# Patient Record
Sex: Male | Born: 1995 | Race: Black or African American | Hispanic: No | Marital: Single | Smoking: Never smoker
Health system: Southern US, Community
[De-identification: ages and names within clinical notes are randomized; demographics above are authoritative.]

## PROBLEM LIST (undated history)

## (undated) DIAGNOSIS — J45909 Unspecified asthma, uncomplicated: Secondary | ICD-10-CM

## (undated) HISTORY — PX: TONSILLECTOMY: SUR1361

---

## 2013-05-13 ENCOUNTER — Ambulatory Visit: Payer: BC Managed Care – PPO | Admitting: Internal Medicine

## 2013-05-13 VITALS — BP 110/60 | HR 62 | Temp 98.6°F | Resp 16 | Ht 70.0 in | Wt 177.0 lb

## 2013-05-13 DIAGNOSIS — S3120XA Unspecified open wound of penis, initial encounter: Secondary | ICD-10-CM

## 2013-05-13 MED ORDER — MUPIROCIN 2 % EX OINT: 1.0000 "application " | TOPICAL_OINTMENT | Freq: Three times a day (TID) | CUTANEOUS | Status: AC

## 2013-05-13 MED ORDER — DOXYCYCLINE HYCLATE 100 MG PO TABS: 100.0000 mg | ORAL_TABLET | Freq: Two times a day (BID) | ORAL | Status: AC

## 2013-05-13 NOTE — Progress Notes (Signed)
   Subjective:    Patient ID: Nicholas Barrera, male    DOB: 03/17/1995, 18 y.o.   MRN: [id number]  HPI pt her c/o of a scar in his penis x  2 months. Now it is getting bigger, itchy and irritated. Two sexual partners within the last 6 months no protection used. He has never had this before. Denies discharge, fever, chills or abdominal pain. Denies any sti in the past. No STD hx. No sores, blisters seen. No rashes seen   Review of Systems     Objective:   Physical Exam  Vitals reviewed. Constitutional: He is oriented to person, place, and time. He appears well-developed and well-nourished. No distress.  HENT:  Head: Normocephalic.  Mouth/Throat: Oropharynx is clear and moist.  Eyes: EOM are normal.  Neck: Normal range of motion.  Pulmonary/Chest: Effort normal.  Abdominal: Hernia confirmed negative in the right inguinal area and confirmed negative in the left inguinal area.  Genitourinary: Testes normal.    Circumcised.  Small healing abrasion base of glans. No erythema, no lesions  Musculoskeletal: Normal range of motion.  Lymphadenopathy:       Right: No inguinal adenopathy present.       Left: No inguinal adenopathy present.  Neurological: He is alert and oriented to person, place, and time. He exhibits normal muscle tone. Coordination normal.  Skin: Rash noted. No erythema.  Psychiatric: He has a normal mood and affect.          Assessment & Plan:  Most likely abrasion penis No hx for HSV but distant possiblity/Nothing to culture Doxycycline/Mupirocin till healed Reat penis from all activity 1 week

## 2013-05-13 NOTE — Patient Instructions (Signed)
Herpes Simplex Herpes simplex is generally classified as Type 1 or Type 2. Type 1 is generally the type that is responsible for cold sores. Type 2 is generally associated with sexually transmitted diseases. We now know that most of the thoughts on these viruses are inaccurate. We find that HSV1 is also present genitally and HSV2 can be present orally, but this will vary in different locations of the world. Herpes simplex is usually detected by doing a culture. Blood tests are also available for this virus; however, the accuracy is often not as good.  PREPARATION FOR TEST No preparation or fasting is necessary. NORMAL FINDINGS  No virus present  No HSV antigens or antibodies present Ranges for normal findings may vary among different laboratories and hospitals. You should always check with your doctor after having lab work or other tests done to discuss the meaning of your test results and whether your values are considered within normal limits. MEANING OF TEST  Your caregiver will go over the test results with you and discuss the importance and meaning of your results, as well as treatment options and the need for additional tests if necessary. OBTAINING THE TEST RESULTS  It is your responsibility to obtain your test results. Ask the lab or department performing the test when and how you will get your results. Document Released: 02/23/2004 Document Revised: 04/14/2011 Document Reviewed: 01/01/2008 ExitCare Patient Information 2014 [employer].  

## 2014-07-24 ENCOUNTER — Emergency Department (HOSPITAL_COMMUNITY)
Admission: EM | Admit: 2014-07-24 | Discharge: 2014-07-24 | Disposition: A | Discharge status: Home / Self Care | Payer: BLUE CROSS/BLUE SHIELD | Attending: Emergency Medicine | Admitting: Emergency Medicine

## 2014-07-24 ENCOUNTER — Encounter (HOSPITAL_COMMUNITY): Payer: Self-pay | Admitting: Emergency Medicine

## 2014-07-24 DIAGNOSIS — Z79899 Other long term (current) drug therapy: Secondary | ICD-10-CM | POA: Diagnosis not present

## 2014-07-24 DIAGNOSIS — F419 Anxiety disorder, unspecified: Secondary | ICD-10-CM | POA: Insufficient documentation

## 2014-07-24 DIAGNOSIS — R251 Tremor, unspecified: Secondary | ICD-10-CM | POA: Insufficient documentation

## 2014-07-24 DIAGNOSIS — Z7951 Long term (current) use of inhaled steroids: Secondary | ICD-10-CM | POA: Diagnosis not present

## 2014-07-24 DIAGNOSIS — R001 Bradycardia, unspecified: Secondary | ICD-10-CM | POA: Diagnosis not present

## 2014-07-24 DIAGNOSIS — J45909 Unspecified asthma, uncomplicated: Secondary | ICD-10-CM | POA: Diagnosis not present

## 2014-07-24 HISTORY — DX: Unspecified asthma, uncomplicated: J45.909

## 2014-07-24 LAB — I-STAT CHEM 8, ED
BUN: 14 mg/dL (ref 6–20)
Calcium, Ion: 1.18 mmol/L (ref 1.12–1.23)
Chloride: 102 mmol/L (ref 101–111)
Creatinine, Ser: 1.1 mg/dL (ref 0.61–1.24)
Glucose, Bld: 90 mg/dL (ref 65–99)
HCT: 44 % (ref 39.0–52.0)
HEMOGLOBIN: 15 g/dL (ref 13.0–17.0)
Potassium: 3.8 mmol/L (ref 3.5–5.1)
SODIUM: 138 mmol/L (ref 135–145)
TCO2: 23 mmol/L (ref 0–100)

## 2014-07-24 NOTE — ED Notes (Addendum)
Pt from home. States he "feels weird." Drove home from [place] this after noon and around 2:15am his muscles began twitching. He has no complaints of pain or discomfort at this time. His peripheral pulses are strong and within normal limits.

## 2014-07-24 NOTE — ED Notes (Signed)
Pt unable to remain still or relax for vital signs.

## 2014-07-24 NOTE — ED Provider Notes (Signed)
CSN: [id number]     Arrival date & time 07/24/14  0227 History   First MD Initiated Contact with Patient 07/24/14 0408     Chief Complaint  Patient presents with  . Tremors     (Consider location/radiation/quality/duration/timing/severity/associated sxs/prior Treatment) HPI Comments: 19 yo male with chief complaint of shaking/jitteriness and muscle twitching.  This began after he went to sleep early this morning.  He reported driving back to [place] from his home in [place].  During the drive he drank a mountain dew (which he doesn't normally drink).  He also reports drinking sweet tea throughout the day today.  He denied chest pain, heart palpitations, shortness of breath, nausea, or vomiting.  Currently, he feels much better, but symptoms were severe at first.  Duration was a few hours.     Past Medical History  Diagnosis Date  . Asthma    Past Surgical History  Procedure Laterality Date  . Tonsillectomy     History reviewed. No pertinent family history. History  Substance Use Topics  . Smoking status: Never Smoker   . Smokeless tobacco: Not on file  . Alcohol Use: No    Review of Systems  All other systems reviewed and are negative.     Allergies  Review of patient's allergies indicates no known allergies.  Home Medications   Prior to Admission medications   Medication Sig Start Date End Date Taking? Authorizing Provider  budesonide-formoterol (SYMBICORT) 80-4.5 MCG/ACT inhaler Inhale 2 puffs into the lungs daily.   Yes Historical Provider, MD  cetirizine (ZYRTEC) 10 MG tablet Take 10 mg by mouth daily.   Yes Historical Provider, MD  albuterol (PROVENTIL HFA;VENTOLIN HFA) 108 (90 BASE) MCG/ACT inhaler Inhale 2 puffs into the lungs every 6 (six) hours as needed for wheezing or shortness of breath.    Historical Provider, MD  doxycycline (VIBRA-TABS) 100 MG tablet Take 1 tablet (100 mg total) by mouth 2 (two) times daily. Patient not taking: Reported on 07/24/2014  05/13/13   Jonita Albee, MD  mupirocin ointment (BACTROBAN) 2 % Apply 1 application topically 3 (three) times daily. Patient not taking: Reported on 07/24/2014 05/13/13   Ashley Jacobs Guest, MD   BP 115/77 mmHg  Pulse 60  Temp(Src)   Resp 13  SpO2 98% Physical Exam  Constitutional: He is oriented to person, place, and time. He appears well-developed and well-nourished. No distress.  HENT:  Head: Normocephalic and atraumatic.  Mouth/Throat: Oropharynx is clear and moist.  Eyes: Conjunctivae are normal. Pupils are equal, round, and reactive to light. No scleral icterus.  Neck: Neck supple.  Cardiovascular: Regular rhythm, normal heart sounds and intact distal pulses.  Bradycardia present.   No murmur heard. Pulmonary/Chest: Effort normal and breath sounds normal. No stridor. No respiratory distress. He has no wheezes. He has no rales.  Abdominal: Soft. He exhibits no distension. There is no tenderness.  Musculoskeletal: Normal range of motion. He exhibits no edema.  Neurological: He is alert and oriented to person, place, and time.  Skin: Skin is warm and dry. No rash noted.  Psychiatric: He has a normal mood and affect. His behavior is normal.  Nursing note and vitals reviewed.   ED Course  Procedures (including critical care time) Labs Review Labs Reviewed  URINALYSIS, ROUTINE W REFLEX MICROSCOPIC (NOT AT [facility])  URINE RAPID DRUG SCREEN, HOSP PERFORMED  I-STAT CHEM 8, ED    Imaging Review No results found.   EKG Interpretation   Date/Time:  Monday July 24 2014 04:11:43 EDT Ventricular Rate:  38 PR Interval:  181 QRS Duration: 91 QT Interval:  475 QTC Calculation: 378 R Axis:   86 Text Interpretation:  Sinus bradycardia Probable left ventricular  hypertrophy ST elev, probable normal early repol pattern No old tracing to  compare Confirmed by [facility]  MD, TREY ([id number]) on 07/24/2014 4:48:23 AM      MDM   Final diagnoses:  Episode of shaking    Well appearing 19 yo male  with complaint of muscle twitching and jitteriness.  He felt anxious.  Symptoms better now.  I suspect symptoms were likely secondary to caffeine ingestion, although could also be stress related.  He is bradycardic, but reports being chronically so.  Chem 8 was negative.  EKG notable for bradycardia and early repolarization.  No syncope or presyncope.  Advised discontinuing caffeine and outpatient follow up.      Blake Divine, MD 07/24/14 [phone]

## 2014-09-26 ENCOUNTER — Emergency Department (HOSPITAL_COMMUNITY): Payer: BLUE CROSS/BLUE SHIELD

## 2014-09-26 ENCOUNTER — Emergency Department (HOSPITAL_COMMUNITY)
Admission: EM | Admit: 2014-09-26 | Discharge: 2014-09-26 | Disposition: A | Discharge status: Home / Self Care | Payer: BLUE CROSS/BLUE SHIELD | Attending: Emergency Medicine | Admitting: Emergency Medicine

## 2014-09-26 ENCOUNTER — Encounter (HOSPITAL_COMMUNITY): Payer: Self-pay

## 2014-09-26 DIAGNOSIS — K5900 Constipation, unspecified: Secondary | ICD-10-CM | POA: Diagnosis not present

## 2014-09-26 DIAGNOSIS — Z79899 Other long term (current) drug therapy: Secondary | ICD-10-CM | POA: Insufficient documentation

## 2014-09-26 DIAGNOSIS — Z7951 Long term (current) use of inhaled steroids: Secondary | ICD-10-CM | POA: Diagnosis not present

## 2014-09-26 DIAGNOSIS — J45909 Unspecified asthma, uncomplicated: Secondary | ICD-10-CM | POA: Diagnosis not present

## 2014-09-26 DIAGNOSIS — R1084 Generalized abdominal pain: Secondary | ICD-10-CM | POA: Diagnosis present

## 2014-09-26 MED ORDER — GI COCKTAIL ~~LOC~~
30.0000 mL | Freq: Once | ORAL | Status: AC
  Administered 2014-09-26: 30 mL via ORAL
  Filled 2014-09-26: qty 30

## 2014-09-26 MED ORDER — POLYETHYLENE GLYCOL 3350 17 GM/SCOOP PO POWD: 17.0000 g | Freq: Every day | ORAL | Status: AC

## 2014-09-26 NOTE — ED Notes (Signed)
Patient transported to X-ray 

## 2014-09-26 NOTE — ED Notes (Signed)
Pt complains of abdominal pain for one day, no vomiting or diarrhea, he states that he broke out in a sweat once

## 2014-09-26 NOTE — Discharge Instructions (Signed)
°Bloating °Bloating is the feeling of fullness in your belly. You may feel as though your pants are too tight. Often the cause of bloating is overeating, retaining fluids, or having gas in your bowel. It is also caused by swallowing air and eating foods that cause gas. Irritable bowel syndrome is one of the most common causes of bloating. Constipation is also a common cause. Sometimes more serious problems can cause bloating. °SYMPTOMS  °Usually there is a feeling of fullness, as though your abdomen is bulged out. There may be mild discomfort.  °DIAGNOSIS  °Usually no particular testing is necessary for most bloating. If the condition persists and seems to become worse, your caregiver may do additional testing.  °TREATMENT  °· There is no direct treatment for bloating. °· Do not put gas into the bowel. Avoid chewing gum and sucking on candy. These tend to make you swallow air. Swallowing air can also be a nervous habit. Try to avoid this. °· Avoiding high residue diets will help. Eat foods with soluble fibers (examples include root vegetables, apples, or barley) and substitute dairy products with soy and rice products. This helps irritable bowel syndrome. °· If constipation is the cause, then a high residue diet with more fiber will help. °· Avoid carbonated beverages. °· Over-the-counter preparations are available that help reduce gas. Your pharmacist can help you with this. °SEEK MEDICAL CARE IF:  °· Bloating continues and seems to be getting worse. °· You notice a weight gain. °· You have a weight loss but the bloating is getting worse. °· You have changes in your bowel habits or develop nausea or vomiting. °SEEK IMMEDIATE MEDICAL CARE IF:  °· You develop shortness of breath or swelling in your legs. °· You have an increase in abdominal pain or develop chest pain. °Document Released: 11/20/2005 Document Revised: 04/14/2011 Document Reviewed: 01/08/2007 °ExitCare® Patient Information ©2015 [employer]. This  information is not intended to replace advice given to you by your health care provider. Make sure you discuss any questions you have with your health care provider. ° °Constipation °Constipation is when a person: °· Poops (has a bowel movement) less than 3 times a week. °· Has a hard time pooping. °· Has poop that is dry, hard, or bigger than normal. °HOME CARE  °· Eat foods with a lot of fiber in them. This includes fruits, vegetables, beans, and whole grains such as brown rice. °· Avoid fatty foods and foods with a lot of sugar. This includes french fries, hamburgers, cookies, candy, and soda. °· If you are not getting enough fiber from food, take products with added fiber in them (supplements). °· Drink enough fluid to keep your pee (urine) clear or pale yellow. °· Exercise on a regular basis, or as told by your doctor. °· Go to the restroom when you feel like you need to poop. Do not hold it. °· Only take medicine as told by your doctor. Do not take medicines that help you poop (laxatives) without talking to your doctor first. °GET HELP RIGHT AWAY IF:  °· You have bright red blood in your poop (stool). °· Your constipation lasts more than 4 days or gets worse. °· You have belly (abdominal) or butt (rectal) pain. °· You have thin poop (as thin as a pencil). °· You lose weight, and it cannot be explained. °MAKE SURE YOU:  °· Understand these instructions. °· Will watch your condition. °· Will get help right away if you are not doing well or   get worse. °Document Released: 07/09/2007 Document Revised: 01/25/2013 Document Reviewed: 11/01/2012 °ExitCare® Patient Information ©2015 [employer]. This information is not intended to replace advice given to you by your health care provider. Make sure you discuss any questions you have with your health care provider. ° ° °

## 2014-09-26 NOTE — ED Provider Notes (Signed)
CSN: [id number]     Arrival date & time 09/26/14  0129 History   First MD Initiated Contact with Patient 09/26/14 [phone]     Chief Complaint  Patient presents with  . Abdominal Pain     (Consider location/radiation/quality/duration/timing/severity/associated sxs/prior Treatment) Patient is a 19 y.o. male presenting with abdominal pain. The history is provided by the patient.  Abdominal Pain Pain location:  Generalized Pain quality: cramping   Pain radiates to:  Does not radiate Pain severity:  Moderate Onset quality:  Sudden Timing:  Constant Progression:  Resolved Chronicity:  New Context: not alcohol use, not recent sexual activity, not recent travel, not retching, not sick contacts, not suspicious food intake and not trauma   Relieved by:  Nothing Worsened by:  Nothing tried Ineffective treatments:  None tried Associated symptoms: no anorexia, no belching, no chest pain, no chills, no cough, no diarrhea, no fever, no flatus, no nausea, no shortness of breath, no sore throat and no vomiting   Risk factors: no alcohol abuse     Past Medical History  Diagnosis Date  . Asthma    Past Surgical History  Procedure Laterality Date  . Tonsillectomy     History reviewed. No pertinent family history. Social History  Substance Use Topics  . Smoking status: Never Smoker   . Smokeless tobacco: None  . Alcohol Use: No    Review of Systems  Constitutional: Negative for fever and chills.  HENT: Negative for sore throat.   Respiratory: Negative for cough and shortness of breath.   Cardiovascular: Negative for chest pain.  Gastrointestinal: Positive for abdominal pain. Negative for nausea, vomiting, diarrhea, anorexia and flatus.  All other systems reviewed and are negative.     Allergies  Review of patient's allergies indicates no known allergies.  Home Medications   Prior to Admission medications   Medication Sig Start Date End Date Taking? Authorizing Provider   albuterol (PROVENTIL HFA;VENTOLIN HFA) 108 (90 BASE) MCG/ACT inhaler Inhale 2 puffs into the lungs every 6 (six) hours as needed for wheezing or shortness of breath.   Yes Historical Provider, MD  budesonide-formoterol (SYMBICORT) 80-4.5 MCG/ACT inhaler Inhale 2 puffs into the lungs daily.   Yes Historical Provider, MD  cetirizine (ZYRTEC) 10 MG tablet Take 10 mg by mouth daily.   Yes Historical Provider, MD  citalopram (CELEXA) 20 MG tablet Take 1 tablet by mouth daily. 09/21/14  Yes Historical Provider, MD  ibuprofen (ADVIL,MOTRIN) 200 MG tablet Take 800 mg by mouth every 6 (six) hours as needed for moderate pain.   Yes Historical Provider, MD  doxycycline (VIBRA-TABS) 100 MG tablet Take 1 tablet (100 mg total) by mouth 2 (two) times daily. Patient not taking: Reported on 07/24/2014 05/13/13   Jonita Albee, MD  mupirocin ointment (BACTROBAN) 2 % Apply 1 application topically 3 (three) times daily. Patient not taking: Reported on 07/24/2014 05/13/13   Ashley Jacobs Guest, MD   BP 110/58 mmHg  Pulse 58  Temp(Src) 98.3 F (36.8 C) (Oral)  Resp 18  Ht  (1.753 m)  Wt 180 lb (81.647 kg)  BMI 26.57 kg/m2  SpO2 100% Physical Exam  Constitutional: He is oriented to person, place, and time. He appears well-developed and well-nourished. No distress.  HENT:  Head: Normocephalic and atraumatic.  Mouth/Throat: Oropharynx is clear and moist.  Eyes: EOM are normal. Pupils are equal, round, and reactive to light.  Neck: Normal range of motion. Neck supple.  Cardiovascular: Normal rate, regular rhythm and intact  distal pulses.   Pulmonary/Chest: Effort normal and breath sounds normal. No respiratory distress. He has no wheezes. He has no rales.  Abdominal: Soft. He exhibits no mass. Bowel sounds are increased. There is no tenderness. There is no rebound, no guarding, no tenderness at McBurney's point and negative Barua's sign.  Musculoskeletal: Normal range of motion.  Neurological: He is alert and  oriented to person, place, and time.  Skin: Skin is warm and dry.  Psychiatric: He has a normal mood and affect.    ED Course  Procedures (including critical care time) Labs Review Labs Reviewed - No data to display  Imaging Review Dg Abd 1 View  09/26/2014   CLINICAL DATA:  Mid generalized abdominal pain for 1 day.  EXAM: ABDOMEN - 1 VIEW  COMPARISON:  None.  FINDINGS: The bowel gas pattern is normal. Small to moderate volume of colonic stool. No evidence of free air. No radio-opaque calculi. No osseous abnormalities.  IMPRESSION: Negative.   Electronically Signed   By: Rubye Oaks M.D.   On: 09/26/2014 05:29   I have personally reviewed and evaluated these images and lab results as part of my medical decision-making.   EKG Interpretation None      MDM   Final diagnoses:  None    Constipation with gas.  Exam and vitals are benign and reassuring.  Patient sleeping soundly in the room and even falls asleep during history and discharge.  Well appearing, safe for discharge at this time.  Start miralax.  Strict return precautions    Marili Vader, MD 09/26/14 [phone]

## 2015-03-01 ENCOUNTER — Encounter (HOSPITAL_COMMUNITY): Payer: Self-pay | Admitting: Emergency Medicine

## 2015-03-01 ENCOUNTER — Emergency Department (HOSPITAL_COMMUNITY)
Admission: EM | Admit: 2015-03-01 | Discharge: 2015-03-01 | Discharge status: Against Medical Advice | Payer: BLUE CROSS/BLUE SHIELD | Attending: Emergency Medicine | Admitting: Emergency Medicine

## 2015-03-01 DIAGNOSIS — R42 Dizziness and giddiness: Secondary | ICD-10-CM | POA: Insufficient documentation

## 2015-03-01 DIAGNOSIS — J45909 Unspecified asthma, uncomplicated: Secondary | ICD-10-CM | POA: Diagnosis not present

## 2015-03-01 LAB — BASIC METABOLIC PANEL
ANION GAP: 10 (ref 5–15)
BUN: 13 mg/dL (ref 6–20)
CO2: 25 mmol/L (ref 22–32)
Calcium: 9 mg/dL (ref 8.9–10.3)
Chloride: 105 mmol/L (ref 101–111)
Creatinine, Ser: 1.2 mg/dL (ref 0.61–1.24)
GFR calc Af Amer: >60 mL/min (ref >60)
GFR calc non Af Amer: >60 mL/min (ref >60)
Glucose, Bld: 96 mg/dL (ref 65–99)
POTASSIUM: 3.8 mmol/L (ref 3.5–5.1)
Sodium: 140 mmol/L (ref 135–145)

## 2015-03-01 LAB — CBC
HEMATOCRIT: 39.9 % (ref 39.0–52.0)
Hemoglobin: 13.2 g/dL (ref 13.0–17.0)
MCH: 30.8 pg (ref 26.0–34.0)
MCHC: 33.1 g/dL (ref 30.0–36.0)
MCV: 93.2 fL (ref 78.0–100.0)
Platelets: 266 10*3/uL (ref 150–400)
RBC: 4.28 MIL/uL (ref 4.22–5.81)
RDW: 12.6 % (ref 11.5–15.5)
WBC: 4.8 10*3/uL (ref 4.0–10.5)

## 2015-03-01 NOTE — ED Notes (Signed)
Per pt, states he drank 4 LOCO drinks and worked out last night-now feels jittery

## 2015-03-10 ENCOUNTER — Emergency Department (HOSPITAL_COMMUNITY): Payer: BLUE CROSS/BLUE SHIELD

## 2015-03-10 ENCOUNTER — Emergency Department (HOSPITAL_COMMUNITY)
Admission: EM | Admit: 2015-03-10 | Discharge: 2015-03-10 | Disposition: A | Discharge status: Home / Self Care | Payer: BLUE CROSS/BLUE SHIELD | Attending: Emergency Medicine | Admitting: Emergency Medicine

## 2015-03-10 DIAGNOSIS — F10120 Alcohol abuse with intoxication, uncomplicated: Secondary | ICD-10-CM | POA: Insufficient documentation

## 2015-03-10 DIAGNOSIS — Y9289 Other specified places as the place of occurrence of the external cause: Secondary | ICD-10-CM | POA: Insufficient documentation

## 2015-03-10 DIAGNOSIS — Y998 Other external cause status: Secondary | ICD-10-CM | POA: Insufficient documentation

## 2015-03-10 DIAGNOSIS — Y9389 Activity, other specified: Secondary | ICD-10-CM | POA: Insufficient documentation

## 2015-03-10 DIAGNOSIS — F10920 Alcohol use, unspecified with intoxication, uncomplicated: Secondary | ICD-10-CM

## 2015-03-10 DIAGNOSIS — R4182 Altered mental status, unspecified: Secondary | ICD-10-CM | POA: Diagnosis present

## 2015-03-10 DIAGNOSIS — X58XXXA Exposure to other specified factors, initial encounter: Secondary | ICD-10-CM | POA: Diagnosis not present

## 2015-03-10 DIAGNOSIS — S81812A Laceration without foreign body, left lower leg, initial encounter: Secondary | ICD-10-CM | POA: Insufficient documentation

## 2015-03-10 LAB — COMPREHENSIVE METABOLIC PANEL
ALT: 32 U/L (ref 17–63)
ANION GAP: 9 (ref 5–15)
AST: 39 U/L (ref 15–41)
Albumin: 4.4 g/dL (ref 3.5–5.0)
Alkaline Phosphatase: 52 U/L (ref 38–126)
BUN: 12 mg/dL (ref 6–20)
CHLORIDE: 111 mmol/L (ref 101–111)
CO2: 25 mmol/L (ref 22–32)
Calcium: 9.1 mg/dL (ref 8.9–10.3)
Creatinine, Ser: 1.16 mg/dL (ref 0.61–1.24)
GFR calc non Af Amer: >60 mL/min (ref >60)
Glucose, Bld: 100 mg/dL — ABNORMAL HIGH (ref 65–99)
POTASSIUM: 4.1 mmol/L (ref 3.5–5.1)
SODIUM: 145 mmol/L (ref 135–145)
Total Bilirubin: 0.4 mg/dL (ref 0.3–1.2)
Total Protein: 7.4 g/dL (ref 6.5–8.1)

## 2015-03-10 LAB — URINALYSIS, ROUTINE W REFLEX MICROSCOPIC
BILIRUBIN URINE: NEGATIVE
Glucose, UA: NEGATIVE mg/dL
HGB URINE DIPSTICK: NEGATIVE
KETONES UR: NEGATIVE mg/dL
Leukocytes, UA: NEGATIVE
NITRITE: NEGATIVE
PH: 5.5 (ref 5.0–8.0)
Protein, ur: NEGATIVE mg/dL
Specific Gravity, Urine: 1.005 (ref 1.005–1.030)

## 2015-03-10 LAB — DIFFERENTIAL
BASOS ABS: 0 10*3/uL (ref 0.0–0.1)
BASOS PCT: 0 %
EOS PCT: 2 %
Eosinophils Absolute: 0.1 10*3/uL (ref 0.0–0.7)
Lymphocytes Relative: 36 %
Lymphs Abs: 1.8 10*3/uL (ref 0.7–4.0)
MONO ABS: 0.2 10*3/uL (ref 0.1–1.0)
Monocytes Relative: 3 %
NEUTROS ABS: 2.9 10*3/uL (ref 1.7–7.7)
Neutrophils Relative %: 59 %

## 2015-03-10 LAB — RAPID URINE DRUG SCREEN, HOSP PERFORMED
AMPHETAMINES: NOT DETECTED
BENZODIAZEPINES: NOT DETECTED
Barbiturates: NOT DETECTED
Cocaine: NOT DETECTED
Opiates: NOT DETECTED
TETRAHYDROCANNABINOL: NOT DETECTED

## 2015-03-10 LAB — I-STAT CHEM 8, ED
BUN: 10 mg/dL (ref 6–20)
CALCIUM ION: 1.2 mmol/L (ref 1.12–1.23)
CHLORIDE: 108 mmol/L (ref 101–111)
Creatinine, Ser: 1.5 mg/dL — ABNORMAL HIGH (ref 0.61–1.24)
Glucose, Bld: 96 mg/dL (ref 65–99)
HEMATOCRIT: 43 % (ref 39.0–52.0)
Hemoglobin: 14.6 g/dL (ref 13.0–17.0)
POTASSIUM: 4 mmol/L (ref 3.5–5.1)
SODIUM: 147 mmol/L — AB (ref 135–145)
TCO2: 27 mmol/L (ref 0–100)

## 2015-03-10 LAB — CBC
HEMATOCRIT: 40.6 % (ref 39.0–52.0)
HEMOGLOBIN: 13.4 g/dL (ref 13.0–17.0)
MCH: 31.6 pg (ref 26.0–34.0)
MCHC: 33 g/dL (ref 30.0–36.0)
MCV: 95.8 fL (ref 78.0–100.0)
Platelets: 269 10*3/uL (ref 150–400)
RBC: 4.24 MIL/uL (ref 4.22–5.81)
RDW: 13.1 % (ref 11.5–15.5)
WBC: 4.9 10*3/uL (ref 4.0–10.5)

## 2015-03-10 LAB — I-STAT TROPONIN, ED: Troponin i, poc: 0.01 ng/mL (ref 0.00–0.08)

## 2015-03-10 LAB — ETHANOL: Alcohol, Ethyl (B): 330 mg/dL (ref <5)

## 2015-03-10 LAB — PROTIME-INR
INR: 0.92 (ref 0.00–1.49)
Prothrombin Time: 12.6 seconds (ref 11.6–15.2)

## 2015-03-10 LAB — APTT: APTT: 31 s (ref 24–37)

## 2015-03-10 LAB — CBG MONITORING, ED: GLUCOSE-CAPILLARY: 87 mg/dL (ref 65–99)

## 2015-03-10 MED ORDER — NALOXONE HCL 2 MG/2ML IJ SOSY
2.0000 mg | PREFILLED_SYRINGE | Freq: Once | INTRAMUSCULAR | Status: AC
  Administered 2015-03-10: 2 mg via INTRAVENOUS

## 2015-03-10 MED ORDER — SODIUM CHLORIDE 0.9 % IV BOLUS (SEPSIS)
1000.0000 mL | Freq: Once | INTRAVENOUS | Status: AC
  Administered 2015-03-10: 1000 mL via INTRAVENOUS

## 2015-03-10 MED ORDER — AMMONIA AROMATIC IN INHA
0.3000 mL | Freq: Once | RESPIRATORY_TRACT | Status: DC
  Filled 2015-03-10: qty 10

## 2015-03-10 NOTE — ED Notes (Signed)
Pt is asleep and won't arouse to sternal rub.  Doesn't smell of ETOH and doesn't appear to be in any distress.  Unable to complete triage assessment d/t pt not awaking.  Placed on pulse ox.  Friend at b/s.

## 2015-03-10 NOTE — ED Notes (Signed)
Pt brought from room 5 unresponsive. Dr Rosalia Hammers at bedside

## 2015-03-10 NOTE — ED Notes (Addendum)
Will not perform stroke swallow screen until pt is awake enough to swallow w/o choking. Dr Rosalia Hammers made aware. Pt also covered with multiple blankets for warming per verbal order

## 2015-03-10 NOTE — ED Notes (Signed)
Pt was found by staff at [facility] on the floor of men's locker room.  He had swiped in at the gym approx an hour after found.  EMS reports that he sat up and spoke to them there, but in route he has become harder to arouse.  EMS VS: 106/50,70 NSR,  20, 96%RA, CBG 119.  Pt's emergency contact is his father but he's in [place].

## 2015-03-10 NOTE — ED Provider Notes (Signed)
CSN: [id number]     Arrival date & time 03/10/15  1436 History   First MD Initiated Contact with Patient 03/10/15 1510     Chief Complaint  Patient presents with  . Altered Mental Status    Level 5 caveat (Consider location/radiation/quality/duration/timing/severity/associated sxs/prior Treatment) HPI 20 y.o. Male reportedly found on floor at gym in locker room unresponsive.  EMS called ad transported to hospital.  En route cbg 119, bp 106/50, hr 70, sats 96%.  GF present here and reports that she thinks he was drinking earlier in the day.  No report of past medical history.  No past medical history on file. No past surgical history on file. No family history on file. Social History  Substance Use Topics  . Smoking status: Not on file  . Smokeless tobacco: Not on file  . Alcohol Use: Not on file    Review of Systems  Unable to perform ROS: Mental status change      Allergies  Review of patient's allergies indicates not on file.  Home Medications   Prior to Admission medications   Not on File   BP 103/51 mmHg  SpO2 97% Physical Exam  Constitutional: He appears well-developed and well-nourished. No distress.  HENT:  Head: Normocephalic and atraumatic.  Right Ear: External ear normal.  Left Ear: External ear normal.  Nose: Nose normal.  Mouth/Throat: Oropharynx is clear and moist.  No gag reflex on initial exam  Eyes:  Pupils 8 and sluggishly reactive  Neck: Normal range of motion. Neck supple. No JVD present. No tracheal deviation present. No thyromegaly present.  Cardiovascular: Normal rate, regular rhythm, normal heart sounds and intact distal pulses.   Pulmonary/Chest: Effort normal and breath sounds normal. No stridor. No respiratory distress. He has no wheezes. He has no rales.  Abdominal: Soft. Bowel sounds are normal. He exhibits no distension. There is tenderness. There is rebound.  Musculoskeletal:  Laceration left lower anterior leg  Neurological: He is  unresponsive.  Patient initially unresponsive but through exam , after narcan, patient becam more responsive with eye opening and answering yes to some questions.  Appears to move all four extremities well.  Nursing note and vitals reviewed.   ED Course  Procedures (including critical care time) Labs Review Labs Reviewed  I-STAT CHEM 8, ED - Abnormal; Notable for the following:    Sodium 147 (*)    Creatinine, Ser 1.50 (*)    All other components within normal limits  PROTIME-INR  APTT  CBC  DIFFERENTIAL  URINALYSIS, ROUTINE W REFLEX MICROSCOPIC (NOT AT [facility])  COMPREHENSIVE METABOLIC PANEL  ETHANOL  URINE RAPID DRUG SCREEN, HOSP PERFORMED  CBG MONITORING, ED  I-STAT TROPOININ, ED  CBG MONITORING, ED    Imaging Review No results found. I have personally reviewed and evaluated these images and lab results as part of my medical decision-making.   EKG Interpretation   Date/Time:  Saturday March 10 2015 15:29:30 EST Ventricular Rate:  82 PR Interval:  181 QRS Duration: 96 QT Interval:  347 QTC Calculation: 405 R Axis:   94 Text Interpretation:  Sinus rhythm Borderline right axis deviation  Probable left ventricular hypertrophy Nonspecific T abnormalities, lateral  leads Confirmed by Zelma Mazariego MD, Duwayne Heck ([id number]) on 03/10/2015 3:56:53 PM      MDM   Final diagnoses:  Alcohol intoxication, uncomplicated (HCC)    Acutely altered mental status 1- ? Seizure with prolonged post ictal stage 2-toxicology- etoh and uds being done 3- infectious- unlikely as no report  of recent illness and rather abrupt onset. 4-ich- ct pending 5-cv- patient with adequate bp and not hypertensive here   5:22 PM Patient with blood along level of 3:30. In terms likely secondary to acute alcohol intoxication. We will recheck at the two-hour point to assure improving mentation 6:06 PM Patient opens eyes and answer some questions. Appears to move all 4 extremities equally. Discussed patient's  alcohol use with girlfriend. He states that the police were called one time earlier last month because he was wondering around the parking lot intoxicated. 9:18 PM Patient awake and alert and appropriately oriented. He wishes to be discharged. I discussed alcohol abuse and his need to seek treatment. He will be given referrals for community resources.  Margarita Grizzle, MD 03/14/15 [phone]

## 2015-03-10 NOTE — Discharge Instructions (Signed)
Alcohol Intoxication Alcohol intoxication occurs when you drink enough alcohol that it affects your ability to function. It can be mild or very severe. Drinking a lot of alcohol in a short time is called binge drinking. This can be very harmful. Drinking alcohol can also be more dangerous if you are taking medicines or other drugs. Some of the effects caused by alcohol may include:  Loss of coordination.  Changes in mood and behavior.  Unclear thinking.  Trouble talking (slurred speech).  Throwing up (vomiting).  Confusion.  Slowed breathing.  Twitching and shaking (seizures).  Loss of consciousness. HOME CARE  Do not drive after drinking alcohol.  Drink enough water and fluids to keep your pee (urine) clear or pale yellow. Avoid caffeine.  Only take medicine as told by your doctor. GET HELP IF:  You throw up (vomit) many times.  You do not feel better after a few days.  You frequently have alcohol intoxication. Your doctor can help decide if you should see a substance use treatment counselor. GET HELP RIGHT AWAY IF:  You become shaky when you stop drinking.  You have twitching and shaking.  You throw up blood. It may look bright red or like coffee grounds.  You notice blood in your poop (bowel movements).  You become lightheaded or pass out (faint). MAKE SURE YOU:   Understand these instructions.  Will watch your condition.  Will get help right away if you are not doing well or get worse.   This information is not intended to replace advice given to you by your health care provider. Make sure you discuss any questions you have with your health care provider.   Document Released: 07/09/2007 Document Revised: 09/22/2012 Document Reviewed: 06/25/2012 Elsevier Interactive Patient Education 2016 [employer]Community Resource Guide Outpatient Counseling/Substance Abuse Adult The United Ways 211 is a great source of information about community services  available.  Access by dialing 2-1-1 from anywhere in [place], or by website -  PooledIncome.plwww.nc211.org.   Other Local Resources (Updated 02/2015)  Crisis Hotlines   Services     Area Served  [facility]  Crisis Hotline, available 24 hours a day, 7 days a week: 986-112-1450323-761-1407 [facility] [place], [place]   [facility]  Crisis Hotline, available 24 hours a day, 7 days a week: ([phone] [phone] [facility], [place]  [facility]  Suicide Prevention Hotline, available 24 hours a day, 7 days a week: 317-443-4180 [facility], [place]  [facility]   Crisis Hotline, available 24 hours a day, 7 days a week: 316-155-9640947-665-6106 [facility], [place]   [facility] Access to [employer]Care Line  Crisis Hotline, available 24 hours a day, 7 days a week: 901-675-4686272-619-3312 All   [facility]  Crisis Hotline, available 24 hours a day, 7 days a week: (775) 118-65166011998600 All   Other Local Resources (Updated 02/2015)  Outpatient Counseling/ Substance Abuse Programs  Services     Address and Phone Number  ADS (Alcohol and Drug Services)   Options include Individual counseling, group counseling, intensive outpatient program (several hours a day, several days a week)  Offers depression assessments  Provides methadone maintenance program [phone] [address], [address] [place], [place] [address]   [facility]   Offers partial hospitalization/day treatment and DUI/DWI programs  [employer]s Medicare, private insurance [phone] [address], [address] [place], [place] [address]  [facility]    Services include intensive outpatient program (several hours a day, several days a week), outpatient treatment, DUI/DWI services, family education  Also has some services specifically for [employer]  Offers transitional housing  [phone] [address] [place], [place] [address]     [facility]  Accepts Medicare, private pay, and private insurance  [phone] [address], [address] [place], [place] [address]  [facility]  Services include individual counseling, substance abuse intensive outpatient program (several hours a day, several days a week), day treatment  Delene Lollccepts Medicare, Medicaid, private insurance [phone] [address], [address] [place], [place] [address]  [facility]   Offers substance abuse intensive outpatient program (several hours a day, several days a week), partial hospitalization program [phone] [address] [place], [place] [address]  [phone] [address] [place], [place] [address]  [phone] [address] [place], [place] [address]  [phone] [address] [place], [place] [address]  [facility]  Individual counseling only  Accepts private insurance only [phone] [address], [address] [place], [place] [address]  [facility]  Methadone maintenance program [phone] [address] [place], [place] [address]  [facility]  Walk-In Clinic providing substance abuse and mental health counseling  Accepts Medicaid, Medicare, private insurance  Offers sliding scale for uninsured [phone] [address] [place], [place]   [facility], [employer].  Offers individual counseling, and intensive in-home services [phone] [address], [address] [place], [place] [address]  [facility]  Offers individual counseling, family counseling, group therapy, domestic violence counseling, consumer credit counseling  Accepts Medicare, Medicaid, private insurance  Offers sliding scale for uninsured [phone] [address] [place], [place] [address]  [phone] [facility], [address] [place], [place] [address]  [facility]  Offers individual, family and group counseling  3 locations - [place], [place], and  [place]  [phone]  [address] [place], [place] [address]  [address] [place], [place] [address]  [address] [place], [place] [address]  [facility]    Offers psychiatric assessment, 8-week Intensive Outpatient Program (several hours a day, several times a week, daytime or evenings), early recovery group, family Program, medication management  Private pay or private insurance only [phone], or  [phone] [place], [place] [address]  [facility]  Offers individual, couples and family counseling  Accepts Medicaid, private insurance, and sliding scale for uninsured [phone] [address] [place], [place] [address]  Len Blalockavid Fuller, MD  Individual counseling  Private insurance [phone] [address] [place], [place] [address]  [facility]   Offers assessment, substance abuse treatment, and behavioral health treatment [phone] [address] [place], [place] [address]  [facility]  Individual counseling  Accepts private insurance [phone] [address] [place], [place] [address]  [facility]  Individual counseling  Delene Lollccepts Medicare, private insurance [phone] [address] [place], [place] [address]  [facility]    Offers intensive outpatient program (several hours a day, several times a week)  Private pay, private insurance [phone] [facility] [address] [facility] [place], [place]  [facility]  Individual counseling  Medicare, private insurance [phone] [address], [address] [place], [place] [address]  [facility]    Offers intensive outpatient program (several hours a day, several times a week) and partial hospitalization program [phone] [address] [place], [place] [address]  Emerson MonteParrish McKinney, MD  Individual counseling [phone] [address], [address]  [place], [place] [address]  [facility]  Offers Christian counseling to individuals, couples, and families  Accepts Medicare and private insurance; offers sliding scale for uninsured [phone] [address] [place], [place] [address]  [facility]  [place] counseling [phone] [address] [place], [place] [address]  [facility] crisis counseling, individual counseling, group therapy, in-home therapy, domestic violence services, day treatment, DWI services, Administrator, arts (CST), Doctor, hospital (ACTT), substance abuse Intensive Outpatient Program (several hours a day, several times a week)  2 locations - [place] [phone] [address] [place], [place] [address]  [phone] [address] [place], [place] [address]  [facility]     Individual counseling and group therapy  [employer], [place], [place] [phone] [address] [place], [place]  [facility]  Offers individual and family counseling  Offers LGBTQ services  Accepts private insurance and private pay [phone] [address] [place], [place] [address]  [facility]    Offers individual counseling, group therapy, and outpatient detox  Accepts private insurance [phone] [address] [place], [place]  [facility]  Individual counseling  Accepts Medicare, private insurance [phone] [address] [place], [place] [address]  [employer]  Individual counseling  Accepts Medicare, private insurance [phone] [address] [place], [place] [address]  [facility]   Offers substance abuse Intensive Outpatient Program (several hours a day, several times a week) [phone], or [phone] [place], [place]

## 2015-03-10 NOTE — ED Notes (Signed)
Bed: WA05 Expected date:  Expected time:  Means of arrival:  Comments: AMS 

## 2016-05-04 IMAGING — CR DG ABDOMEN 1V
1 series · 1 of 1 positions shown · non-contrast
Comparison: None.

CLINICAL DATA: Mid generalized abdominal pain for 1 day.

EXAM:
ABDOMEN - 1 VIEW

[t abdomen supine]
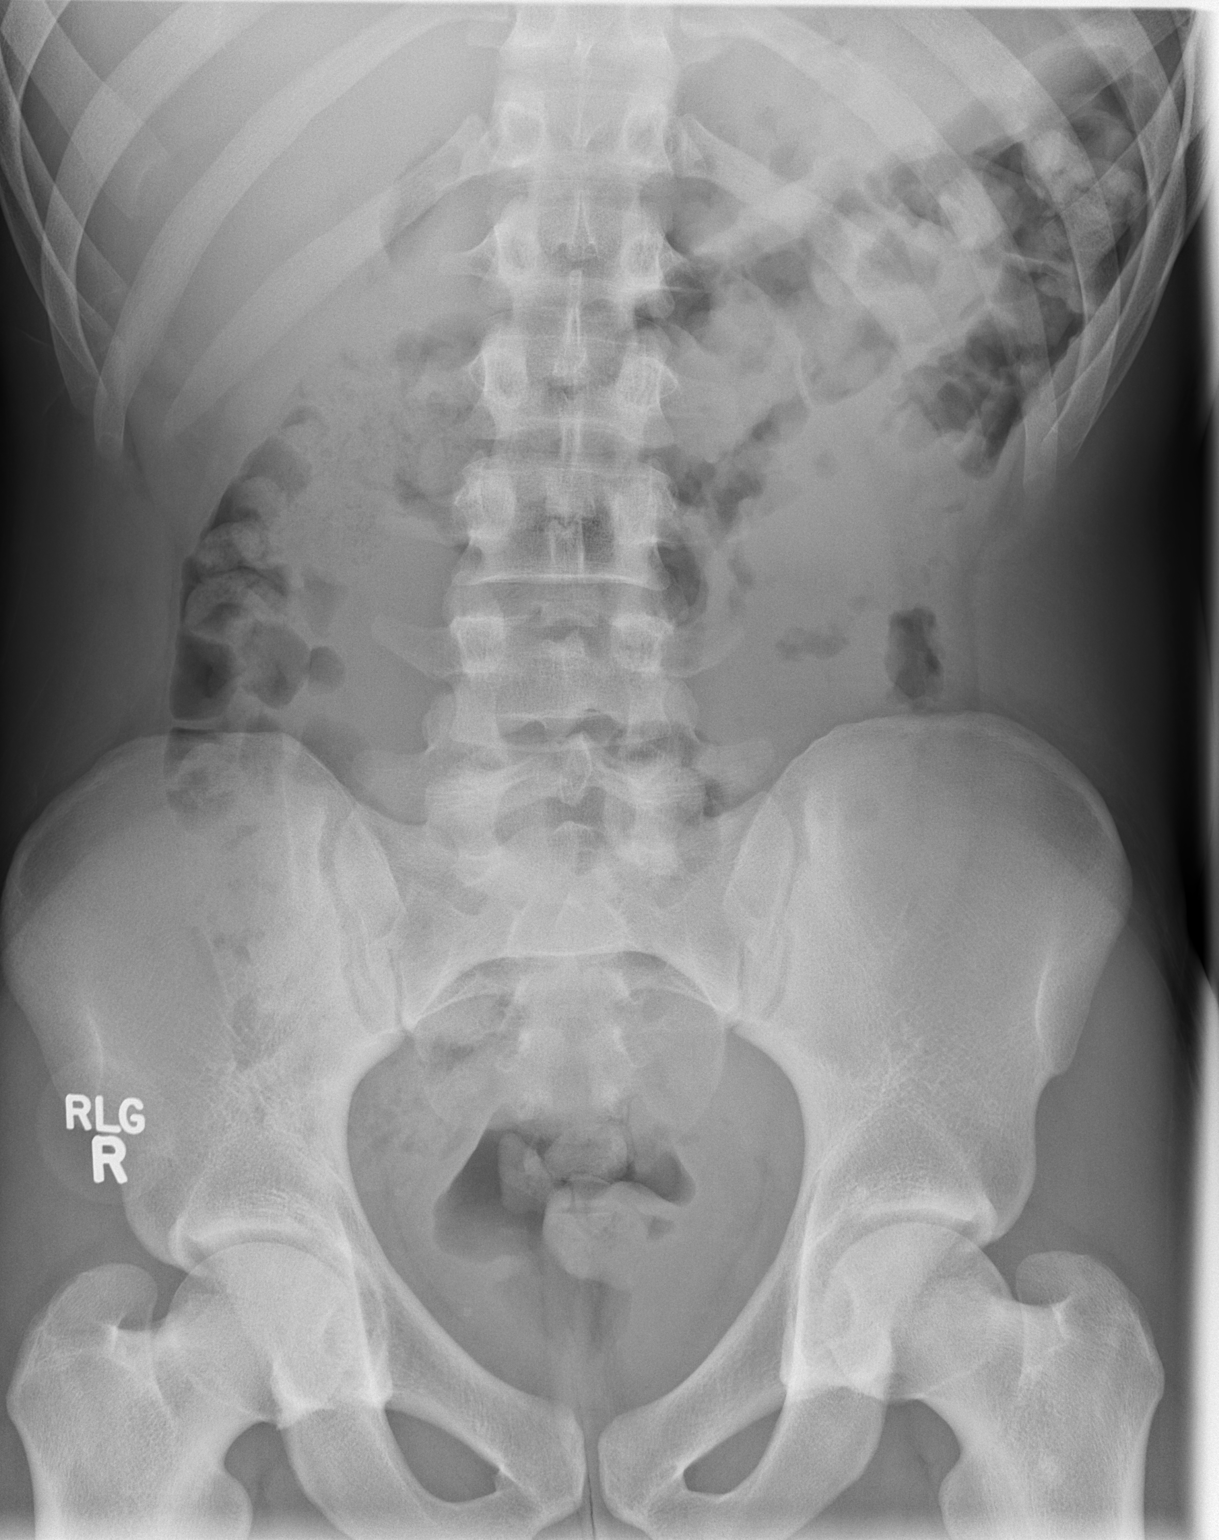

[1 of 1 positions shown; findings below may reference images not displayed]

FINDINGS: The bowel gas pattern is normal. Small to moderate volume of colonic
stool. No evidence of free air. No radio-opaque calculi. No osseous
abnormalities.
IMPRESSION: Negative.
